# Patient Record
Sex: Female | Born: 1975 | Race: White | Hispanic: No | Marital: Married | Smoking: Never smoker
Health system: Southern US, Community
[De-identification: ages and names within clinical notes are randomized; demographics above are authoritative.]

## PROBLEM LIST (undated history)

## (undated) DIAGNOSIS — K219 Gastro-esophageal reflux disease without esophagitis: Secondary | ICD-10-CM

## (undated) HISTORY — DX: Gastro-esophageal reflux disease without esophagitis: K21.9

## (undated) HISTORY — PX: BACK SURGERY: SHX140

---

## 2001-03-18 ENCOUNTER — Inpatient Hospital Stay (HOSPITAL_COMMUNITY): Admission: AD | Admit: 2001-03-18 | Discharge: 2001-03-20 | Payer: Self-pay | Admitting: *Deleted

## 2004-08-31 ENCOUNTER — Ambulatory Visit (HOSPITAL_COMMUNITY): Admission: RE | Admit: 2004-08-31 | Discharge: 2004-08-31 | Payer: Self-pay | Admitting: Internal Medicine

## 2006-10-26 ENCOUNTER — Emergency Department (HOSPITAL_COMMUNITY): Admission: EM | Admit: 2006-10-26 | Discharge: 2006-10-26 | Payer: Self-pay | Admitting: Emergency Medicine

## 2008-01-26 ENCOUNTER — Ambulatory Visit (HOSPITAL_COMMUNITY): Admission: RE | Admit: 2008-01-26 | Discharge: 2008-01-26 | Payer: Self-pay | Admitting: General Surgery

## 2009-02-01 ENCOUNTER — Ambulatory Visit (HOSPITAL_COMMUNITY): Admission: RE | Admit: 2009-02-01 | Discharge: 2009-02-01 | Payer: Self-pay | Admitting: Family Medicine

## 2009-10-10 ENCOUNTER — Ambulatory Visit: Payer: Self-pay

## 2011-02-13 NOTE — H&P (Signed)
NAME:  Stacy Steele, Stacy Steele NO.:  [id number]   MEDICAL RECORD NO.:  [id number]          PATIENT TYPE:  AMB   LOCATION:  DAY                           FACILITY:  [facility]   PHYSICIAN:  Dalia Heading, M.D.  DATE OF BIRTH:  [birth year]   DATE OF ADMISSION:  DATE OF DISCHARGE:  LH                              HISTORY & PHYSICAL   CHIEF COMPLAINT:  Hematochezia, family history of colon carcinoma.   HISTORY OF PRESENT ILLNESS:  The patient is a 35 year old white female,  who is referred for endoscopic evaluation.  She needs a colonoscopy for  a history of hematochezia and also a family history of colon carcinoma.  Her mother was diagnosed with colon carcinoma at the age of 43 and has  since passed away.  No abdominal pain, weight loss, nausea, vomiting,  diarrhea, constipation, or melena have been noted.  She has noted some  blood on the toilet paper when she wipes herself.  She denies  hemorrhoidal disease.  She has never had a colonoscopy.   PAST MEDICAL HISTORY:  Unremarkable.   PAST SURGICAL HISTORY:  Unremarkable.   CURRENT MEDICATIONS:  Aciphex.   ALLERGIES:  No known drug allergies.   REVIEW OF SYSTEMS:  Noncontributory.   PHYSICAL EXAMINATION:  GENERAL:  The patient is a well-developed, well-  nourished white female, in no acute distress.  LUNGS:  Clear to auscultation with equal breath sounds bilaterally.  HEART:  Reveals a regular rate and rhythm without S3, S4, or murmurs.  ABDOMEN:  Soft, nontender, nondistended.  No hepatomegaly or masses  noted.  RECTAL:  Deferred to the procedure.   IMPRESSION:  Hematochezia, family history of colon carcinoma.   PLAN:  The patient is scheduled for a colonoscopy on January 26, 2008.  The risks and benefits of the procedure including bleeding and  perforation were fully explained to the patient, gave informed consent.      Dalia Heading, M.D.  Electronically Signed     MAJ/MEDQ  D:  01/06/2008  T:  01/07/2008   Job:  [id number]   cc:   Madelin Rear. Sherwood Gambler, MD  Fax: [phone]

## 2013-10-27 LAB — HM PAP SMEAR: HM Pap smear: NORMAL

## 2014-06-02 ENCOUNTER — Other Ambulatory Visit (HOSPITAL_COMMUNITY): Payer: Self-pay | Admitting: Internal Medicine

## 2014-06-02 DIAGNOSIS — M549 Dorsalgia, unspecified: Secondary | ICD-10-CM

## 2014-06-04 ENCOUNTER — Ambulatory Visit (HOSPITAL_COMMUNITY)
Admission: RE | Admit: 2014-06-04 | Discharge: 2014-06-04 | Disposition: A | Discharge status: Home / Self Care | Payer: 59 | Source: Ambulatory Visit | Attending: Internal Medicine | Admitting: Internal Medicine

## 2014-06-04 ENCOUNTER — Encounter (HOSPITAL_COMMUNITY): Payer: Self-pay

## 2014-06-04 DIAGNOSIS — G8929 Other chronic pain: Secondary | ICD-10-CM | POA: Diagnosis not present

## 2014-06-04 DIAGNOSIS — M5126 Other intervertebral disc displacement, lumbar region: Secondary | ICD-10-CM | POA: Diagnosis not present

## 2014-06-04 DIAGNOSIS — M129 Arthropathy, unspecified: Secondary | ICD-10-CM | POA: Diagnosis not present

## 2014-06-04 DIAGNOSIS — M545 Low back pain, unspecified: Secondary | ICD-10-CM | POA: Insufficient documentation

## 2014-06-04 DIAGNOSIS — M51379 Other intervertebral disc degeneration, lumbosacral region without mention of lumbar back pain or lower extremity pain: Secondary | ICD-10-CM | POA: Insufficient documentation

## 2014-06-04 DIAGNOSIS — M549 Dorsalgia, unspecified: Secondary | ICD-10-CM

## 2014-06-04 DIAGNOSIS — M47817 Spondylosis without myelopathy or radiculopathy, lumbosacral region: Secondary | ICD-10-CM | POA: Insufficient documentation

## 2014-06-04 DIAGNOSIS — M5137 Other intervertebral disc degeneration, lumbosacral region: Secondary | ICD-10-CM | POA: Diagnosis not present

## 2014-10-05 ENCOUNTER — Encounter (INDEPENDENT_AMBULATORY_CARE_PROVIDER_SITE_OTHER): Payer: Self-pay | Admitting: *Deleted

## 2014-11-08 ENCOUNTER — Ambulatory Visit (INDEPENDENT_AMBULATORY_CARE_PROVIDER_SITE_OTHER): Payer: 59 | Admitting: Internal Medicine

## 2014-11-08 ENCOUNTER — Encounter (INDEPENDENT_AMBULATORY_CARE_PROVIDER_SITE_OTHER): Payer: Self-pay | Admitting: Internal Medicine

## 2014-11-08 ENCOUNTER — Other Ambulatory Visit (INDEPENDENT_AMBULATORY_CARE_PROVIDER_SITE_OTHER): Payer: Self-pay | Admitting: *Deleted

## 2014-11-08 ENCOUNTER — Telehealth (INDEPENDENT_AMBULATORY_CARE_PROVIDER_SITE_OTHER): Payer: Self-pay | Admitting: *Deleted

## 2014-11-08 VITALS — BP 130/86 | HR 64 | Temp 99.0°F | Ht 68.0 in | Wt 241.6 lb

## 2014-11-08 DIAGNOSIS — Z800 Family history of malignant neoplasm of digestive organs: Secondary | ICD-10-CM

## 2014-11-08 DIAGNOSIS — K625 Hemorrhage of anus and rectum: Secondary | ICD-10-CM

## 2014-11-08 MED ORDER — PEG-KCL-NACL-NASULF-NA ASC-C 100 G PO SOLR: 1.0000 | Freq: Once | ORAL | Status: DC

## 2014-11-08 NOTE — Telephone Encounter (Signed)
Patient needs movi prep 

## 2014-11-08 NOTE — Progress Notes (Signed)
   Subjective:    Patient ID: Stacy Steele, female    DO: 07-30-1976, 39 y.o.   MRN: [id number]  HPI Referred to our office by Dr. Gerarda Fraction [facility]) for rectal bleeding/colonoscopy. Rectal bleeding started back in the summer. She says the bleeding was heavy. It turned the water in the commode red. Occurred 3-4 times. She says there were clots in the commode. Now she says the bleeding is lighter. She sees on the toilet tissue.  Her stools are normal caliber.  Stools are brown in color. No change in her stools.  There is not constipation.  Appetite is good. No weight loss. No abdominal pain, Family hx of colon cancer in a mother, deceased age 39.  LMP 10/25/14 normal.  She says her periods are heavy. Her last colonoscopy age 68 by Dr. Arnoldo Morale and was normal.  H and H 12.3 and 39.0, MCV 78.    Review of Systems Past Medical History  Diagnosis Date  . GERD (gastroesophageal reflux disease)     Past Surgical History  Procedure Laterality Date  . Back surgery      06/25/2014 lower back    No Known Allergies  No current outpatient prescriptions on file prior to visit.   No current facility-administered medications on file prior to visit.   Married, no children. Works at [employer]  One child age 17 in good health.     Objective:   Physical Exam Filed Vitals:   11/08/14 1059  Height: 5\' 8"  (1.727 m)  Weight: 241 lb 9.6 oz (109.589 kg)   Alert and oriented. Skin warm and dry. Oral mucosa is moist.   . Sclera anicteric, conjunctivae is pink. Thyroid not enlarged. No cervical lymphadenopathy. Lungs clear. Heart regular rate and rhythm.  Abdomen is soft. Bowel sounds are positive. No hepatomegaly. No abdominal masses felt. No tenderness.  No edema to lower extremities.   Rectal deferred.      Assessment & Plan:  Rectal bleeding. Family hx of colon cancer. Needs surveillance colonoscopy. The risks and benefits such as perforation, bleeding, and infection were  reviewed with the patient and is agreeable.

## 2014-11-08 NOTE — Patient Instructions (Signed)
Colonoscopy.  The risks and benefits such as perforation, bleeding, and infection were reviewed with the patient and is agreeable. 

## 2014-11-12 ENCOUNTER — Encounter (HOSPITAL_COMMUNITY): Payer: Self-pay | Admitting: *Deleted

## 2014-11-12 ENCOUNTER — Encounter (HOSPITAL_COMMUNITY)
Admission: RE | Disposition: A | Discharge status: Home / Self Care | Payer: Self-pay | Source: Ambulatory Visit | Attending: Internal Medicine

## 2014-11-12 ENCOUNTER — Ambulatory Visit (HOSPITAL_COMMUNITY)
Admission: RE | Admit: 2014-11-12 | Discharge: 2014-11-12 | Disposition: A | Discharge status: Home / Self Care | Payer: 59 | Source: Ambulatory Visit | Attending: Internal Medicine | Admitting: Internal Medicine

## 2014-11-12 DIAGNOSIS — Z79899 Other long term (current) drug therapy: Secondary | ICD-10-CM | POA: Diagnosis not present

## 2014-11-12 DIAGNOSIS — K644 Residual hemorrhoidal skin tags: Secondary | ICD-10-CM | POA: Diagnosis not present

## 2014-11-12 DIAGNOSIS — K648 Other hemorrhoids: Secondary | ICD-10-CM

## 2014-11-12 DIAGNOSIS — Z800 Family history of malignant neoplasm of digestive organs: Secondary | ICD-10-CM

## 2014-11-12 DIAGNOSIS — K921 Melena: Secondary | ICD-10-CM | POA: Diagnosis not present

## 2014-11-12 DIAGNOSIS — K219 Gastro-esophageal reflux disease without esophagitis: Secondary | ICD-10-CM | POA: Insufficient documentation

## 2014-11-12 DIAGNOSIS — K625 Hemorrhage of anus and rectum: Secondary | ICD-10-CM

## 2014-11-12 DIAGNOSIS — Z1211 Encounter for screening for malignant neoplasm of colon: Secondary | ICD-10-CM | POA: Insufficient documentation

## 2014-11-12 DIAGNOSIS — D125 Benign neoplasm of sigmoid colon: Secondary | ICD-10-CM | POA: Insufficient documentation

## 2014-11-12 HISTORY — PX: COLONOSCOPY: SHX5424

## 2014-11-12 SURGERY — COLONOSCOPY
Anesthesia: Moderate Sedation

## 2014-11-12 MED ORDER — STERILE WATER FOR IRRIGATION IR SOLN
Status: DC | PRN
  Administered 2014-11-12: 11:00:00

## 2014-11-12 MED ORDER — MIDAZOLAM HCL 5 MG/5ML IJ SOLN
INTRAMUSCULAR | Status: AC
  Filled 2014-11-12: qty 10

## 2014-11-12 MED ORDER — SODIUM CHLORIDE 0.9 % IV SOLN
INTRAVENOUS | Status: DC
  Administered 2014-11-12: 11:00:00 via INTRAVENOUS

## 2014-11-12 MED ORDER — BENEFIBER DRINK MIX PO PACK: 4.0000 g | PACK | Freq: Every day | ORAL | Status: AC

## 2014-11-12 MED ORDER — MIDAZOLAM HCL 5 MG/5ML IJ SOLN
INTRAMUSCULAR | Status: AC
  Filled 2014-11-12: qty 5

## 2014-11-12 MED ORDER — MIDAZOLAM HCL 5 MG/5ML IJ SOLN
INTRAMUSCULAR | Status: DC | PRN
  Administered 2014-11-12: 2 mg via INTRAVENOUS
  Administered 2014-11-12: 1 mg via INTRAVENOUS
  Administered 2014-11-12 (×4): 2 mg via INTRAVENOUS
  Administered 2014-11-12: 3 mg via INTRAVENOUS

## 2014-11-12 MED ORDER — MEPERIDINE HCL 50 MG/ML IJ SOLN
INTRAMUSCULAR | Status: DC | PRN
  Administered 2014-11-12 (×2): 25 mg via INTRAVENOUS

## 2014-11-12 MED ORDER — MEPERIDINE HCL 50 MG/ML IJ SOLN
INTRAMUSCULAR | Status: AC
  Filled 2014-11-12: qty 1

## 2014-11-12 NOTE — Discharge Instructions (Signed)
No aspirin or NSAIDs for 1 week. Resume usual medications and high fiber diet. Benefiber 4 g by mouth daily at bedtime. No driving for 24 hours. Physician will call with biopsy results.

## 2014-11-12 NOTE — Op Note (Addendum)
COLONOSCOPY PROCEDURE REPORT  PATIENT:  Stacy Steele  MR#:  [id number] Birthdate:  11-10-1975, 39 y.o., female Endoscopist:  Dr. Rogene Houston, MD Referred By:  Dr. Glo Herring, MD  Procedure Date: 11/12/2014  Procedure:   Colonoscopy with snare polypectomy.  Indications:  Patient is 80 old Caucasian female was intermittent hematochezia. Family history significant for CRC in a mother who was 53 at the time of diagnosis and died 2 years later of metastatic disease. Patient's last colonoscopy was 6 years ago.  Informed Consent:  The procedure and risks were reviewed with the patient and informed consent was obtained.  Medications:  Demerol 50 mg IV Versed 12 mg IV  Description of procedure:  After a digital rectal exam was performed, that colonoscope was advanced from the anus through the rectum and colon to the area of the cecum, ileocecal valve and appendiceal orifice. The cecum was deeply intubated. These structures were well-seen and photographed for the record. From the level of the cecum and ileocecal valve, the scope was slowly and cautiously withdrawn. The mucosal surfaces were carefully surveyed utilizing scope tip to flexion to facilitate fold flattening as needed. The scope was pulled down into the rectum where a thorough exam including retroflexion was performed.  Findings:   Marginal prep at ascending colon and cecum. Landmarks were well seen after vigorous washing. 6 mm broad-based polyp hot snared from distal sigmoid colon. Normal rectal mucosa. Hemorrhoids below the dentate line.   Therapeutic/Diagnostic Maneuvers Performed:  See above  Complications:  None  Cecal Withdrawal Time:  13 minutes  Impression:  Examination performed to cecum. Marginal prep at ascending colon and cecum. Landmarks well seen after vigorous washing. 6 mm polyp hot snare from distal sigmoid colon. External hemorrhoids.  Recommendations:  Standard instructions given. Benefiber 4  g by mouth daily at bedtime. I will contact patient with biopsy results and further recommendations. She will need to day prep prior to her next exam in 5 years.  REHMAN,NAJEEB U  11/12/2014 12:09 PM  CC: Dr. Glo Herring., MD & Dr. Rayne Du ref. provider found

## 2014-11-12 NOTE — H&P (Addendum)
Stacy Steele is an 39 y.o. female.   Chief Complaint: Patient is here for colonoscopy. HPI: Patient is 39 year old Caucasian female who is here for colonoscopy for diagnostic and screening purposes. She has intermittent hematochezia. It is always fresh blood with bowel movements. She denies abdominal pain diarrhea constipation. Last colonoscopy was 6 years ago. Family history significant for CRC in mother was 74 at the time of diagnosis and died of metastatic disease at age 31.  Past Medical History  Diagnosis Date  . GERD (gastroesophageal reflux disease)     Past Surgical History  Procedure Laterality Date  . Back surgery      06/25/2014 lower back    Family History  Problem Relation Age of Onset  . Colon cancer Mother 47   Social History:  reports that she has never smoked. She does not have any smokeless tobacco history on file. She reports that she does not drink alcohol or use illicit drugs.  Allergies: No Known Allergies  Medications Prior to Admission  Medication Sig Dispense Refill  . CALCIUM PO Take 1 tablet by mouth daily.    Marland Kitchen HYDROcodone-acetaminophen (NORCO) 10-325 MG per tablet Take 1 tablet by mouth every 6 (six) hours as needed for moderate pain.     Marland Kitchen lansoprazole (PREVACID) 15 MG capsule Take 15 mg by mouth daily at 12 noon.    . peg 3350 powder (MOVIPREP) 100 G SOLR Take 1 kit (200 g total) by mouth once. 1 kit 0  . Prenatal Vit-Fe Fumarate-FA (PRENATAL MULTIVITAMIN) TABS tablet Take 1 tablet by mouth daily at 12 noon.      No results found for this or any previous visit (from the past 48 hour(s)). No results found.  ROS  Blood pressure 121/56, pulse 72, temperature 98.1 F (36.7 C), temperature source Oral, resp. rate 22, height _0  (1.727 m), weight 241 lb (109.317 kg), last menstrual period 10/22/2014, SpO2 100 %. Physical Exam  Constitutional: She appears well-developed and well-nourished.  HENT:  Mouth/Throat: Oropharynx is clear and moist.   Eyes: Conjunctivae are normal. No scleral icterus.  Neck: No thyromegaly present.  Cardiovascular: Normal rate, regular rhythm and normal heart sounds.   No murmur heard. Respiratory: Effort normal and breath sounds normal.  GI: Soft. She exhibits no distension and no mass. There is no tenderness.  Musculoskeletal: She exhibits no edema.  Lymphadenopathy:    She has no cervical adenopathy.  Neurological: She is alert.  Skin: Skin is warm and dry.     Assessment/Plan Hematochezia. Family history of CRC in mother at age 69. Diagnostic/high-risk screening colonoscopy.  REHMAN,NAJEEB U 11/12/2014, 11:22 AM

## 2014-11-15 ENCOUNTER — Encounter (HOSPITAL_COMMUNITY): Payer: Self-pay | Admitting: Internal Medicine

## 2014-11-17 ENCOUNTER — Encounter (INDEPENDENT_AMBULATORY_CARE_PROVIDER_SITE_OTHER): Payer: Self-pay | Admitting: *Deleted

## 2014-11-18 ENCOUNTER — Encounter (INDEPENDENT_AMBULATORY_CARE_PROVIDER_SITE_OTHER): Payer: Self-pay

## 2015-06-18 ENCOUNTER — Encounter: Payer: Self-pay | Admitting: Emergency Medicine

## 2015-06-18 ENCOUNTER — Emergency Department
Admission: EM | Admit: 2015-06-18 | Discharge: 2015-06-18 | Disposition: A | Discharge status: Home / Self Care | Payer: Medicaid Other | Attending: Emergency Medicine | Admitting: Emergency Medicine

## 2015-06-18 ENCOUNTER — Emergency Department: Payer: Medicaid Other

## 2015-06-18 DIAGNOSIS — Z79899 Other long term (current) drug therapy: Secondary | ICD-10-CM | POA: Diagnosis not present

## 2015-06-18 DIAGNOSIS — Z3A09 9 weeks gestation of pregnancy: Secondary | ICD-10-CM | POA: Insufficient documentation

## 2015-06-18 DIAGNOSIS — O200 Threatened abortion: Secondary | ICD-10-CM | POA: Diagnosis not present

## 2015-06-18 DIAGNOSIS — O9989 Other specified diseases and conditions complicating pregnancy, childbirth and the puerperium: Secondary | ICD-10-CM | POA: Diagnosis present

## 2015-06-18 LAB — ABO/RH: ABO/RH(D): A NEG

## 2015-06-18 LAB — COMPREHENSIVE METABOLIC PANEL
ALBUMIN: 4.3 g/dL (ref 3.5–5.0)
ALT: 26 U/L (ref 14–54)
ANION GAP: 7 (ref 5–15)
AST: 31 U/L (ref 15–41)
Alkaline Phosphatase: 59 U/L (ref 38–126)
BILIRUBIN TOTAL: 0.7 mg/dL (ref 0.3–1.2)
BUN: <5 mg/dL — ABNORMAL LOW (ref 6–20)
CO2: 28 mmol/L (ref 22–32)
Calcium: 9.2 mg/dL (ref 8.9–10.3)
Chloride: 103 mmol/L (ref 101–111)
Creatinine, Ser: 0.68 mg/dL (ref 0.44–1.00)
GFR calc non Af Amer: >60 mL/min (ref >60)
Glucose, Bld: 98 mg/dL (ref 65–99)
POTASSIUM: 3.4 mmol/L — AB (ref 3.5–5.1)
Sodium: 138 mmol/L (ref 135–145)
Total Protein: 7.7 g/dL (ref 6.5–8.1)

## 2015-06-18 LAB — URINALYSIS COMPLETE WITH MICROSCOPIC (ARMC ONLY)
BILIRUBIN URINE: NEGATIVE
Bacteria, UA: NONE SEEN
Glucose, UA: NEGATIVE mg/dL
KETONES UR: NEGATIVE mg/dL
LEUKOCYTES UA: NEGATIVE
NITRITE: NEGATIVE
PH: 6 (ref 5.0–8.0)
PROTEIN: NEGATIVE mg/dL
SPECIFIC GRAVITY, URINE: 1.016 (ref 1.005–1.030)

## 2015-06-18 LAB — CBC
HCT: 41.4 % (ref 35.0–47.0)
Hemoglobin: 13.1 g/dL (ref 12.0–16.0)
MCH: 25.1 pg — ABNORMAL LOW (ref 26.0–34.0)
MCHC: 31.6 g/dL — ABNORMAL LOW (ref 32.0–36.0)
MCV: 79.4 fL — ABNORMAL LOW (ref 80.0–100.0)
Platelets: 181 10*3/uL (ref 150–440)
RBC: 5.21 MIL/uL — AB (ref 3.80–5.20)
RDW: 18.1 % — ABNORMAL HIGH (ref 11.5–14.5)
WBC: 11.7 10*3/uL — AB (ref 3.6–11.0)

## 2015-06-18 LAB — HCG, QUANTITATIVE, PREGNANCY: HCG, BETA CHAIN, QUANT, S: 16622 m[IU]/mL — AB (ref <5)

## 2015-06-18 LAB — LIPASE, BLOOD: Lipase: 29 U/L (ref 22–51)

## 2015-06-18 NOTE — ED Notes (Signed)
Pt. Going home with friend 

## 2015-06-18 NOTE — Discharge Instructions (Signed)
Please follow-up with your OB/GYN physician on Monday for recheck. Beta hCG level. Today's level is 16,000. Return to the emergency department for any increased abdominal pain, or any other symptom personally concerning to your self.   Threatened Miscarriage A threatened miscarriage occurs when you have vaginal bleeding during your first 20 weeks of pregnancy but the pregnancy has not ended. If you have vaginal bleeding during this time, your health care provider will do tests to make sure you are still pregnant. If the tests show you are still pregnant and the developing baby (fetus) inside your womb (uterus) is still growing, your condition is considered a threatened miscarriage. A threatened miscarriage does not mean your pregnancy will end, but it does increase the risk of losing your pregnancy (complete miscarriage). CAUSES  The cause of a threatened miscarriage is usually not known. If you go on to have a complete miscarriage, the most common cause is an abnormal number of chromosomes in the developing baby. Chromosomes are the structures inside cells that hold all your genetic material. Some causes of vaginal bleeding that do not result in miscarriage include:  Having sex.  Having an infection.  Normal hormone changes of pregnancy.  Bleeding that occurs when an egg implants in your uterus. RISK FACTORS Risk factors for bleeding in early pregnancy include:  Obesity.  Smoking.  Drinking excessive amounts of alcohol or caffeine.  Recreational drug use. SIGNS AND SYMPTOMS  Light vaginal bleeding.  Mild abdominal pain or cramps. DIAGNOSIS  If you have bleeding with or without abdominal pain before 20 weeks of pregnancy, your health care provider will do tests to check whether you are still pregnant. One important test involves using sound waves and a computer (ultrasound) to create images of the inside of your uterus. Other tests include an internal exam of your vagina and uterus  (pelvic exam) and measurement of your baby's heart rate.  You may be diagnosed with a threatened miscarriage if:  Ultrasound testing shows you are still pregnant.  Your baby's heart rate is strong.  A pelvic exam shows that the opening between your uterus and your vagina (cervix) is closed.  Your heart rate and blood pressure are stable.  Blood tests confirm you are still pregnant. TREATMENT  No treatments have been shown to prevent a threatened miscarriage from going on to a complete miscarriage. However, the right home care is important.  HOME CARE INSTRUCTIONS   Make sure you keep all your appointments for prenatal care. This is very important.  Get plenty of rest.  Do not have sex or use tampons if you have vaginal bleeding.  Do not douche.  Do not smoke or use recreational drugs.  Do not drink alcohol.  Avoid caffeine. SEEK MEDICAL CARE IF:  You have light vaginal bleeding or spotting while pregnant.  You have abdominal pain or cramping.  You have a fever. SEEK IMMEDIATE MEDICAL CARE IF:  You have heavy vaginal bleeding.  You have blood clots coming from your vagina.  You have severe low back pain or abdominal cramps.  You have fever, chills, and severe abdominal pain. MAKE SURE YOU:  Understand these instructions.  Will watch your condition.  Will get help right away if you are not doing well or get worse. Document Released: 09/17/2005 Document Revised: 09/22/2013 Document Reviewed: 07/14/2013 [facility] Patient Information 2015 [place], [place]. This information is not intended to replace advice given to you by your health care provider. Make sure you discuss any questions you have  with your health care provider. ° °

## 2015-06-18 NOTE — ED Notes (Addendum)
Pt reports she is [redacted] weeks pregnant but bleeding since this am with small clots. Cramping abd pain. Pt has had once prior ultrasound at 6 weeks that was WNL

## 2015-06-18 NOTE — ED Provider Notes (Signed)
[facility] Emergency Department Provider Note  Time seen: 5:17 PM  I have reviewed the triage vital signs and the nursing notes.   HISTORY  Chief Complaint Abdominal Pain    HPI Stacy Steele is a 39 y.o. female with a past medical history of gastric reflux who is [redacted] weeks pregnant and presents for vaginal bleeding. According to the patient she sees [facility], had an ultrasound done in 6 weeks that was normal for her. This is the patient's second pregnancy, but her first pregnancy was 14 years ago. States mild abdominal cramping, denies any "pain.". Denies any nausea, vomiting, diarrhea, dysuria. States the vaginal bleeding was moderate last night, and mild/spotting today. States several small clots yesterday night.    Past Medical History  Diagnosis Date  . GERD (gastroesophageal reflux disease)     Patient Active Problem List   Diagnosis Date Noted  . Family hx of colon cancer 11/08/2014    Past Surgical History  Procedure Laterality Date  . Back surgery      06/25/2014 lower back  . Colonoscopy N/A 11/12/2014    Procedure: COLONOSCOPY;  Surgeon: Rogene Houston, MD;  Location: [facility] ENDO SUITE;  Service: Endoscopy;  Laterality: N/A;  120 - moved to 11:25 - Ann to notify pt    Current Outpatient Rx  Name  Route  Sig  Dispense  Refill  . CALCIUM PO   Oral   Take 1 tablet by mouth daily.         Marland Kitchen HYDROcodone-acetaminophen (NORCO) 10-325 MG per tablet   Oral   Take 1 tablet by mouth every 6 (six) hours as needed for moderate pain.          Marland Kitchen lansoprazole (PREVACID) 15 MG capsule   Oral   Take 15 mg by mouth daily at 12 noon.         . Prenatal Vit-Fe Fumarate-FA (PRENATAL MULTIVITAMIN) TABS tablet   Oral   Take 1 tablet by mouth daily at 12 noon.         . Wheat Dextrin (BENEFIBER DRINK MIX) PACK   Oral   Take 4 g by mouth at bedtime.           Allergies Review of patient's allergies indicates no known  allergies.  Family History  Problem Relation Age of Onset  . Colon cancer Mother 54    Social History Social History  Substance Use Topics  . Smoking status: Never Smoker   . Smokeless tobacco: None  . Alcohol Use: No    Review of Systems Constitutional: Negative for fever. Cardiovascular: Negative for chest pain. Respiratory: Negative for shortness of breath. Gastrointestinal: Mild lower abdominal cramping. Genitourinary: Negative for dysuria. Neurological: Negative for headache 10-point ROS otherwise negative.  ____________________________________________   PHYSICAL EXAM:  VITAL SIGNS: ED Triage Vitals  Enc Vitals Group     BP 06/18/15 1434 147/59 mmHg     Pulse Rate 06/18/15 1434 70     Resp 06/18/15 1434 16     Temp 06/18/15 1434 98.4 F (36.9 C)     Temp Source 06/18/15 1434 Oral     SpO2 06/18/15 1434 99 %     Weight 06/18/15 1434 240 lb (108.863 kg)     Height 06/18/15 1434 5\' 8"  (1.727 m)     Head Cir --      Peak Flow --      Pain Score --      Pain Loc --  Pain Edu? --      Excl. in [place]? --     Constitutional: Alert and oriented. Well appearing and in no distress. Eyes: Normal exam ENT   Mouth/Throat: Mucous membranes are moist. Cardiovascular: Normal rate, regular rhythm. No murmur Respiratory: Normal respiratory effort without tachypnea nor retractions. Breath sounds are clear  Gastrointestinal: Soft and nontender. No distention.  There is no CVA tenderness. Musculoskeletal: Nontender with normal range of motion in all extremities.  Neurologic:  Normal speech and language. No gross focal neurologic deficits are appreciated. Psychiatric: Mood and affect are normal. Speech and behavior are normal. Patient exhibits appropriate insight and judgment.  ____________________________________________   RADIOLOGY  You sac and fetal pole present, no cardiac activity noted.  ____________________________________________    INITIAL IMPRESSION  / ASSESSMENT AND PLAN / ED COURSE  Pertinent labs & imaging results that were available during my care of the patient were reviewed by me and considered in my medical decision making (see chart for details).  Patient is a partially [redacted] weeks pregnant who presents for vaginal bleeding. Labs are largely within normal limits. We'll proceed with ultrasound help further evaluate. Patient states mild lower abdominal cramping, but has a nontender exam.  Patient's labs are largely within normal limits. Patient is ultrasound shows a fetal pole, but no definitive cardiac activity. This could possibly be due to an early pregnancy versus miscarriage. We'll have the patient follow-up with OB/GYN in 48-72 hours for recheck of her beta hCG level, and repeat ultrasound in 7-10 days. Patient is agreeable to plan.  ____________________________________________   FINAL CLINICAL IMPRESSION(S) / ED DIAGNOSES  Threatened abortion   Harvest Dark, MD 06/18/15 [phone]

## 2016-06-08 IMAGING — US US OB COMP LESS 14 WK
1 series · 13 of 28 positions shown · non-contrast
Comparison: None.

CLINICAL DATA: Lower abdominal cramping and bleeding. Patient is
pregnant based on the last menstrual period.

EXAM:
OBSTETRIC <14 WK US AND TRANSVAGINAL OB US
TECHNIQUE: Both transabdominal and transvaginal ultrasound examinations were
performed for complete evaluation of the gestation as well as the
maternal uterus, adnexal regions, and pelvic cul-de-sac.
Transvaginal technique was performed to assess early pregnancy.

[Series 1: us ob comp less 14 wk · 0.25mm/px · 13 of 79 slices shown]
[im 3/79]
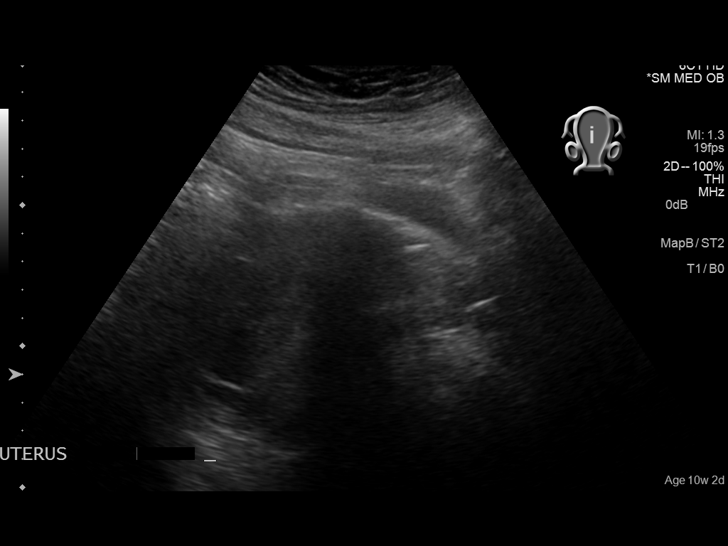
[im 9/79]
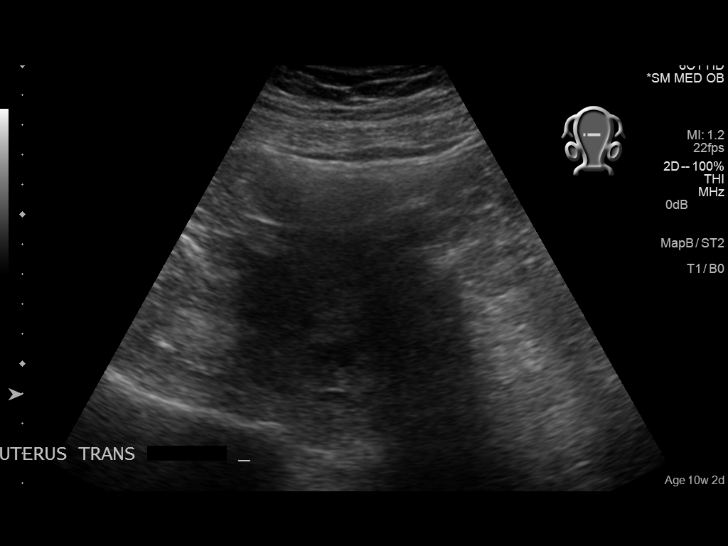
[im 15/79]
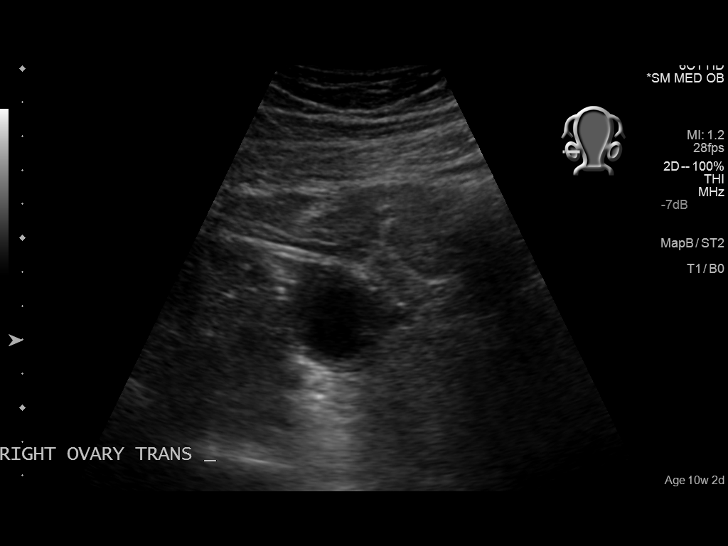
[im 21/79]
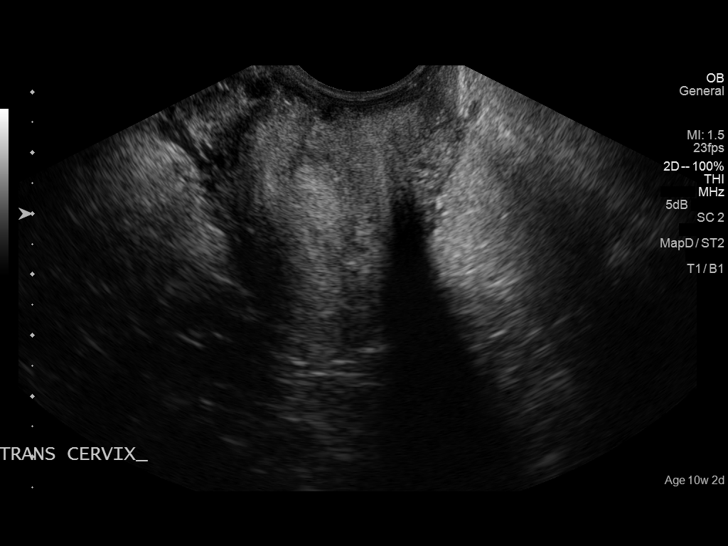
[im 27/79]
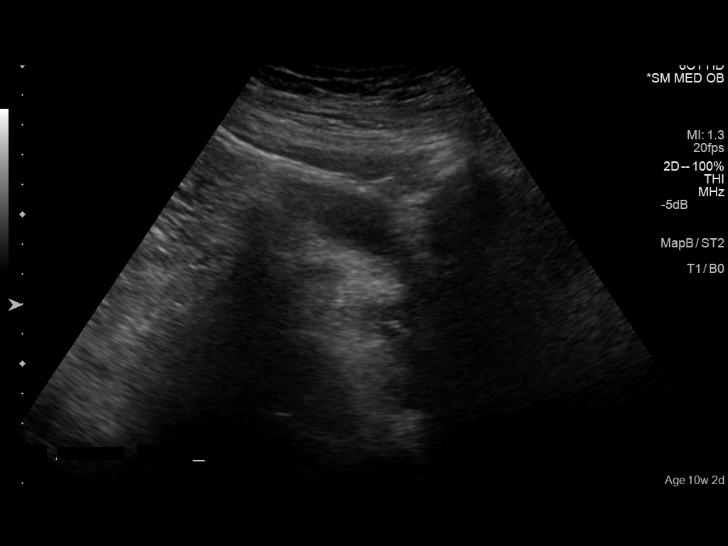
[im 32/79]
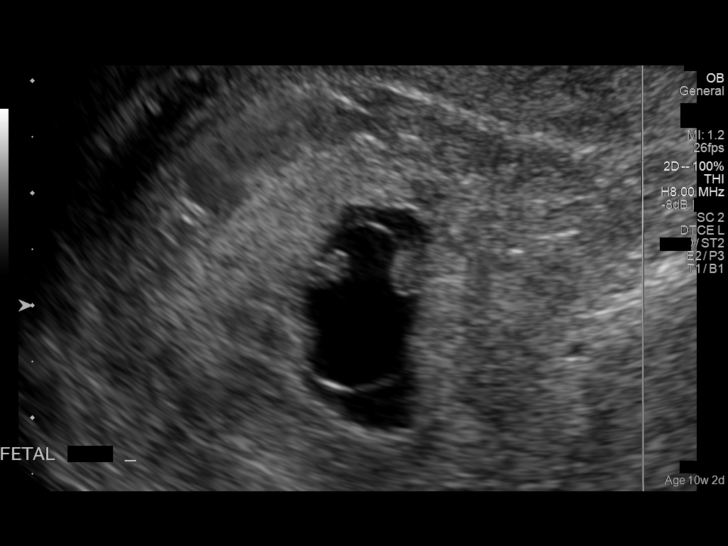
[im 41/79]
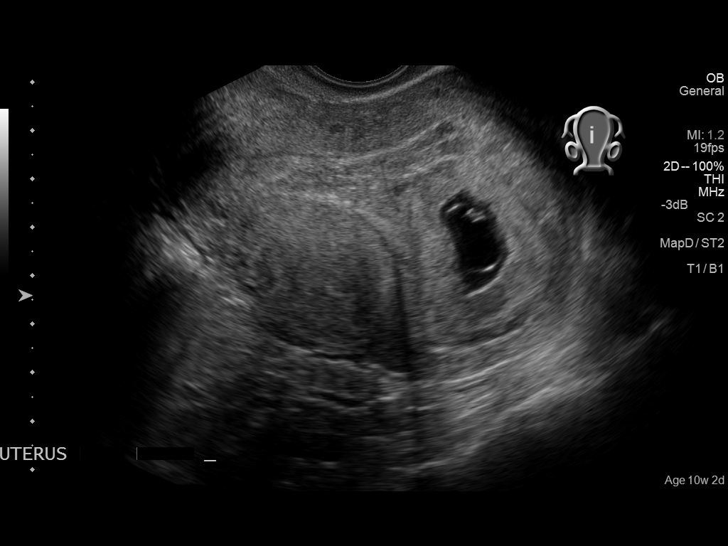
[im 47/79]
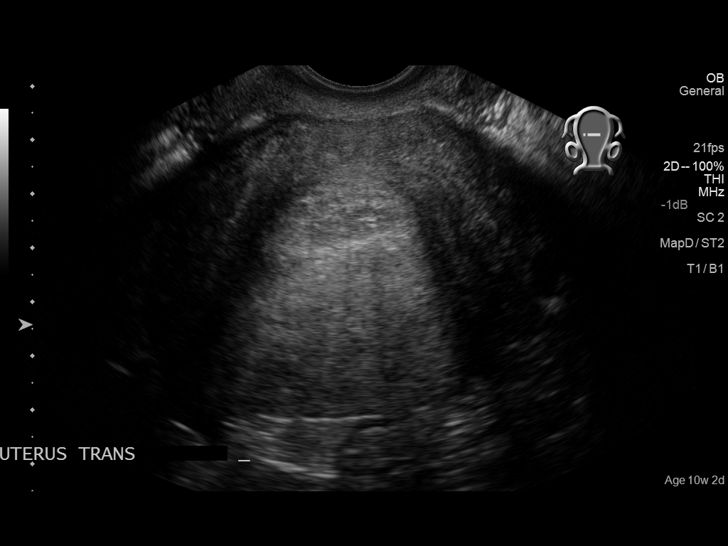
[im 53/79]
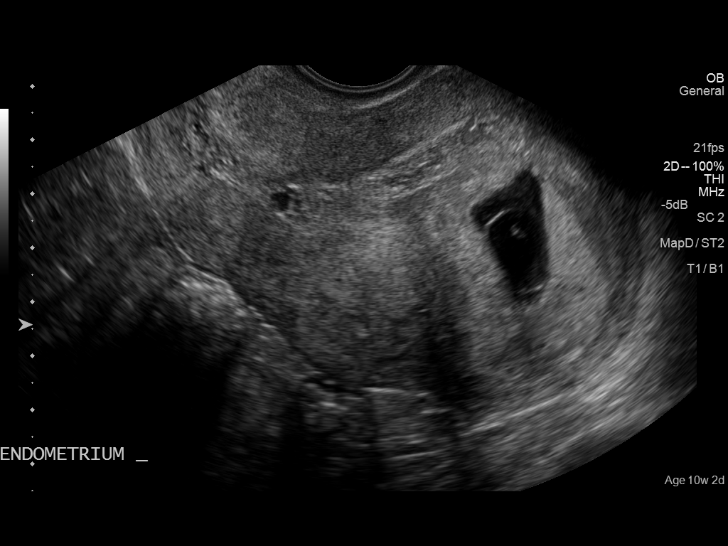
[im 58/79]
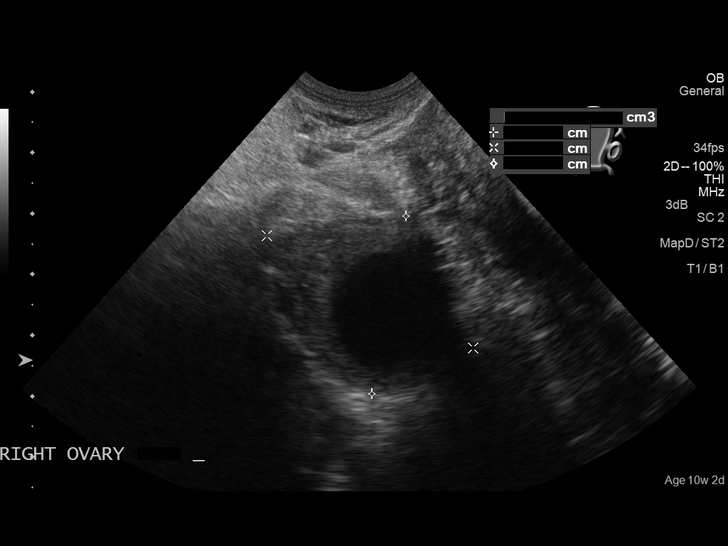
[im 64/79]
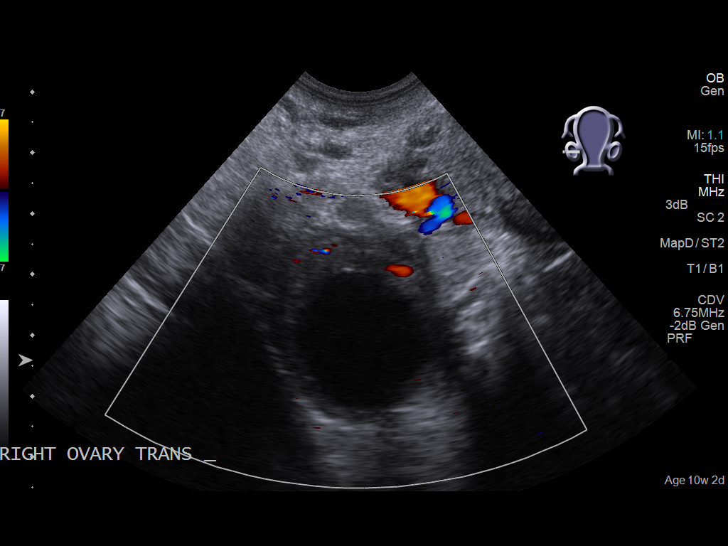
[im 70/79]
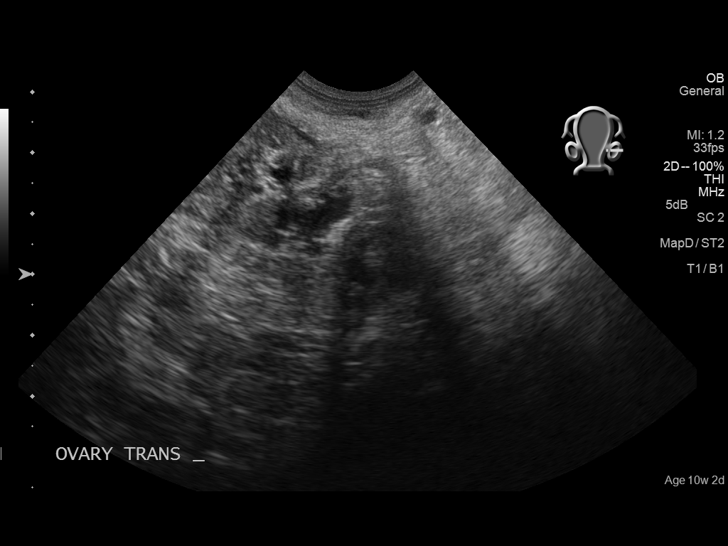
[im 76/79]
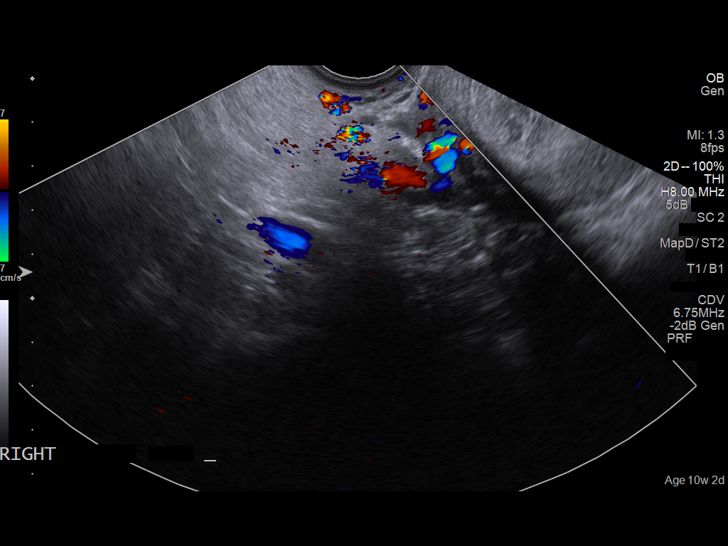

[13 of 28 positions shown; findings below may reference images not displayed]

FINDINGS: Intrauterine gestational sac: Visualized/normal in shape.

Yolk sac:  Yes

Embryo:  Yes

Cardiac Activity: Not definitively seen

Heart Rate: No measurable heart right.

CRL:  4.5  mm   6 w   1 d                  US EDC: 02/10/2016

Maternal uterus/adnexae: There is a probable uterine fibroid
measuring 3.1 x 3.0 x 3.1 cm, mural and location. Somewhat
heterogeneous endometrium. Probable corpus luteum arises from the
right ovary measuring 2.2 cm. Normal left ovary. No adnexal masses.
No pelvic free fluid. No subchorionic hemorrhage.
IMPRESSION: 1. There is a gestational sac containing a yolk sac and fetal pole,
but no defined Cardiac activity or measurable heart beat. Lack
Cardiac activity may simply be from the small size of the embryo.
Recommend followup ultrasound in 7-10 days as well as repeat beta
HCG level. No subchorionic hemorrhage.
2. Probable 3.1 cm uterine fibroid, mural.
3. Ovaries and adnexa are unremarkable.

## 2017-11-22 ENCOUNTER — Encounter: Payer: Self-pay | Admitting: Advanced Practice Midwife

## 2017-11-22 ENCOUNTER — Ambulatory Visit (INDEPENDENT_AMBULATORY_CARE_PROVIDER_SITE_OTHER): Payer: Managed Care, Other (non HMO) | Admitting: Advanced Practice Midwife

## 2017-11-22 VITALS — BP 126/84 | Ht 68.0 in | Wt 236.0 lb

## 2017-11-22 DIAGNOSIS — Z01419 Encounter for gynecological examination (general) (routine) without abnormal findings: Secondary | ICD-10-CM | POA: Diagnosis not present

## 2017-11-22 DIAGNOSIS — Z1239 Encounter for other screening for malignant neoplasm of breast: Secondary | ICD-10-CM

## 2017-11-22 DIAGNOSIS — Z124 Encounter for screening for malignant neoplasm of cervix: Secondary | ICD-10-CM

## 2017-11-22 DIAGNOSIS — Z0000 Encounter for general adult medical examination without abnormal findings: Secondary | ICD-10-CM

## 2017-11-22 DIAGNOSIS — Z1231 Encounter for screening mammogram for malignant neoplasm of breast: Secondary | ICD-10-CM

## 2017-11-22 NOTE — Patient Instructions (Signed)
Health Maintenance, Female Adopting a healthy lifestyle and getting preventive care can go a long way to promote health and wellness. Talk with your health care provider about what schedule of regular examinations is right for you. This is a good chance for you to check in with your provider about disease prevention and staying healthy. In between checkups, there are plenty of things you can do on your own. Experts have done a lot of research about which lifestyle changes and preventive measures are most likely to keep you healthy. Ask your health care provider for more information. Weight and diet Eat a healthy diet  Be sure to include plenty of vegetables, fruits, low-fat dairy products, and lean protein.  Do not eat a lot of foods high in solid fats, added sugars, or salt.  Get regular exercise. This is one of the most important things you can do for your health. ? Most adults should exercise for at least 150 minutes each week. The exercise should increase your heart rate and make you sweat (moderate-intensity exercise). ? Most adults should also do strengthening exercises at least twice a week. This is in addition to the moderate-intensity exercise.  Maintain a healthy weight  Body mass index (BMI) is a measurement that can be used to identify possible weight problems. It estimates body fat based on height and weight. Your health care provider can help determine your BMI and help you achieve or maintain a healthy weight.  For females 69 years of age and older: ? A BMI below 18.5 is considered underweight. ? A BMI of 18.5 to 24.9 is normal. ? A BMI of 25 to 29.9 is considered overweight. ? A BMI of 30 and above is considered obese.  Watch levels of cholesterol and blood lipids  You should start having your blood tested for lipids and cholesterol at 40 years of age, then have this test every 5 years.  You may need to have your cholesterol levels checked more often if: ? Your lipid or  cholesterol levels are high. ? You are older than 40 years of age. ? You are at high risk for heart disease.  Cancer screening Lung Cancer  Lung cancer screening is recommended for adults 70-27 years old who are at high risk for lung cancer because of a history of smoking.  A yearly low-dose CT scan of the lungs is recommended for people who: ? Currently smoke. ? Have quit within the past 15 years. ? Have at least a 30-pack-year history of smoking. A pack year is smoking an average of one pack of cigarettes a day for 1 year.  Yearly screening should continue until it has been 15 years since you quit.  Yearly screening should stop if you develop a health problem that would prevent you from having lung cancer treatment.  Breast Cancer  Practice breast self-awareness. This means understanding how your breasts normally appear and feel.  It also means doing regular breast self-exams. Let your health care provider know about any changes, no matter how small.  If you are in your 20s or 30s, you should have a clinical breast exam (CBE) by a health care provider every 1-3 years as part of a regular health exam.  If you are 68 or older, have a CBE every year. Also consider having a breast X-ray (mammogram) every year.  If you have a family history of breast cancer, talk to your health care provider about genetic screening.  If you are at high risk  for breast cancer, talk to your health care provider about having an MRI and a mammogram every year.  Breast cancer gene (BRCA) assessment is recommended for women who have family members with BRCA-related cancers. BRCA-related cancers include: ? Breast. ? Ovarian. ? Tubal. ? Peritoneal cancers.  Results of the assessment will determine the need for genetic counseling and BRCA1 and BRCA2 testing.  Cervical Cancer Your health care provider may recommend that you be screened regularly for cancer of the pelvic organs (ovaries, uterus, and  vagina). This screening involves a pelvic examination, including checking for microscopic changes to the surface of your cervix (Pap test). You may be encouraged to have this screening done every 3 years, beginning at age 22.  For women ages 56-65, health care providers may recommend pelvic exams and Pap testing every 3 years, or they may recommend the Pap and pelvic exam, combined with testing for human papilloma virus (HPV), every 5 years. Some types of HPV increase your risk of cervical cancer. Testing for HPV may also be done on women of any age with unclear Pap test results.  Other health care providers may not recommend any screening for nonpregnant women who are considered low risk for pelvic cancer and who do not have symptoms. Ask your health care provider if a screening pelvic exam is right for you.  If you have had past treatment for cervical cancer or a condition that could lead to cancer, you need Pap tests and screening for cancer for at least 20 years after your treatment. If Pap tests have been discontinued, your risk factors (such as having a new sexual partner) need to be reassessed to determine if screening should resume. Some women have medical problems that increase the chance of getting cervical cancer. In these cases, your health care provider may recommend more frequent screening and Pap tests.  Colorectal Cancer  This type of cancer can be detected and often prevented.  Routine colorectal cancer screening usually begins at 40 years of age and continues through 40 years of age.  Your health care provider may recommend screening at an earlier age if you have risk factors for colon cancer.  Your health care provider may also recommend using home test kits to check for hidden blood in the stool.  A small camera at the end of a tube can be used to examine your colon directly (sigmoidoscopy or colonoscopy). This is done to check for the earliest forms of colorectal  cancer.  Routine screening usually begins at age 33.  Direct examination of the colon should be repeated every 5-10 years through 40 years of age. However, you may need to be screened more often if early forms of precancerous polyps or small growths are found.  Skin Cancer  Check your skin from head to toe regularly.  Tell your health care provider about any new moles or changes in moles, especially if there is a change in a mole's shape or color.  Also tell your health care provider if you have a mole that is larger than the size of a pencil eraser.  Always use sunscreen. Apply sunscreen liberally and repeatedly throughout the day.  Protect yourself by wearing long sleeves, pants, a wide-brimmed hat, and sunglasses whenever you are outside.  Heart disease, diabetes, and high blood pressure  High blood pressure causes heart disease and increases the risk of stroke. High blood pressure is more likely to develop in: ? People who have blood pressure in the high end of  the normal range (130-139/85-89 mm Hg). ? People who are overweight or obese. ? People who are African American.  If you are 21-29 years of age, have your blood pressure checked every 3-5 years. If you are 3 years of age or older, have your blood pressure checked every year. You should have your blood pressure measured twice-once when you are at a hospital or clinic, and once when you are not at a hospital or clinic. Record the average of the two measurements. To check your blood pressure when you are not at a hospital or clinic, you can use: ? An automated blood pressure machine at a pharmacy. ? A home blood pressure monitor.  If you are between 17 years and 37 years old, ask your health care provider if you should take aspirin to prevent strokes.  Have regular diabetes screenings. This involves taking a blood sample to check your fasting blood sugar level. ? If you are at a normal weight and have a low risk for diabetes,  have this test once every three years after 40 years of age. ? If you are overweight and have a high risk for diabetes, consider being tested at a younger age or more often. Preventing infection Hepatitis B  If you have a higher risk for hepatitis B, you should be screened for this virus. You are considered at high risk for hepatitis B if: ? You were born in a country where hepatitis B is common. Ask your health care provider which countries are considered high risk. ? Your parents were born in a high-risk country, and you have not been immunized against hepatitis B (hepatitis B vaccine). ? You have HIV or AIDS. ? You use needles to inject street drugs. ? You live with someone who has hepatitis B. ? You have had sex with someone who has hepatitis B. ? You get hemodialysis treatment. ? You take certain medicines for conditions, including cancer, organ transplantation, and autoimmune conditions.  Hepatitis C  Blood testing is recommended for: ? Everyone born from [birth year] through 1963. ? Anyone with known risk factors for hepatitis C.  Sexually transmitted infections (STIs)  You should be screened for sexually transmitted infections (STIs) including gonorrhea and chlamydia if: ? You are sexually active and are younger than 40 years of age. ? You are older than 40 years of age and your health care provider tells you that you are at risk for this type of infection. ? Your sexual activity has changed since you were last screened and you are at an increased risk for chlamydia or gonorrhea. Ask your health care provider if you are at risk.  If you do not have HIV, but are at risk, it may be recommended that you take a prescription medicine daily to prevent HIV infection. This is called pre-exposure prophylaxis (PrEP). You are considered at risk if: ? You are sexually active and do not regularly use condoms or know the HIV status of your partner(s). ? You take drugs by injection. ? You are  sexually active with a partner who has HIV.  Talk with your health care provider about whether you are at high risk of being infected with HIV. If you choose to begin PrEP, you should first be tested for HIV. You should then be tested every 3 months for as long as you are taking PrEP. Pregnancy  If you are premenopausal and you may become pregnant, ask your health care provider about preconception counseling.  If you may become  pregnant, take 400 to 800 micrograms (mcg) of folic acid every day.  If you want to prevent pregnancy, talk to your health care provider about birth control (contraception). Osteoporosis and menopause  Osteoporosis is a disease in which the bones lose minerals and strength with aging. This can result in serious bone fractures. Your risk for osteoporosis can be identified using a bone density scan.  If you are 52 years of age or older, or if you are at risk for osteoporosis and fractures, ask your health care provider if you should be screened.  Ask your health care provider whether you should take a calcium or vitamin D supplement to lower your risk for osteoporosis.  Menopause may have certain physical symptoms and risks.  Hormone replacement therapy may reduce some of these symptoms and risks. Talk to your health care provider about whether hormone replacement therapy is right for you. Follow these instructions at home:  Schedule regular health, dental, and eye exams.  Stay current with your immunizations.  Do not use any tobacco products including cigarettes, chewing tobacco, or electronic cigarettes.  If you are pregnant, do not drink alcohol.  If you are breastfeeding, limit how much and how often you drink alcohol.  Limit alcohol intake to no more than 1 drink per day for nonpregnant women. One drink equals 12 ounces of beer, 5 ounces of wine, or 1 ounces of hard liquor.  Do not use street drugs.  Do not share needles.  Ask your health care  provider for help if you need support or information about quitting drugs.  Tell your health care provider if you often feel depressed.  Tell your health care provider if you have ever been abused or do not feel safe at home. This information is not intended to replace advice given to you by your health care provider. Make sure you discuss any questions you have with your health care provider. Document Released: 04/02/2011 Document Revised: 02/23/2016 Document Reviewed: 06/21/2015 Elsevier Interactive Patient Education  2018 [employer].     Why follow it? Research shows. . Those who follow the Mediterranean diet have a reduced risk of heart disease  . The diet is associated with a reduced incidence of Parkinson's and Alzheimer's diseases . People following the diet may have longer life expectancies and lower rates of chronic diseases  . The Dietary Guidelines for Americans recommends the Mediterranean diet as an eating plan to promote health and prevent disease  What Is the Mediterranean Diet?  . Healthy eating plan based on typical foods and recipes of Mediterranean-style cooking . The diet is primarily a plant based diet; these foods should make up a majority of meals   Starches - Plant based foods should make up a majority of meals - They are an important sources of vitamins, minerals, energy, antioxidants, and fiber - Choose whole grains, foods high in fiber and minimally processed items  - Typical grain sources include wheat, oats, barley, corn, brown rice, bulgar, farro, millet, polenta, couscous  - Various types of beans include chickpeas, lentils, fava beans, black beans, white beans   Fruits  Veggies - Large quantities of antioxidant rich fruits & veggies; 6 or more servings  - Vegetables can be eaten raw or lightly drizzled with oil and cooked  - Vegetables common to the traditional Mediterranean Diet include: artichokes, arugula, beets, broccoli, brussel sprouts, cabbage,  carrots, celery, collard greens, cucumbers, eggplant, kale, leeks, lemons, lettuce, mushrooms, okra, onions, peas, peppers, potatoes, pumpkin, radishes, rutabaga,  shallots, spinach, sweet potatoes, turnips, zucchini - Fruits common to the Mediterranean Diet include: apples, apricots, avocados, cherries, clementines, dates, figs, grapefruits, grapes, melons, nectarines, oranges, peaches, pears, pomegranates, strawberries, tangerines  Fats - Replace butter and margarine with healthy oils, such as olive oil, canola oil, and tahini  - Limit nuts to no more than a handful a day  - Nuts include walnuts, almonds, pecans, pistachios, pine nuts  - Limit or avoid candied, honey roasted or heavily salted nuts - Olives are central to the [employer] - can be eaten whole or used in a variety of dishes   Meats Protein - Limiting red meat: no more than a few times a month - When eating red meat: choose lean cuts and keep the portion to the size of deck of cards - Eggs: approx. 0 to 4 times a week  - Fish and lean poultry: at least 2 a week  - Healthy protein sources include, chicken, [place], lean beef, lamb - Increase intake of seafood such as tuna, salmon, trout, mackerel, shrimp, scallops - Avoid or limit high fat processed meats such as sausage and bacon  Dairy - Include moderate amounts of low fat dairy products  - Focus on healthy dairy such as fat free yogurt, skim milk, low or reduced fat cheese - Limit dairy products higher in fat such as whole or 2% milk, cheese, ice cream  Alcohol - Moderate amounts of red wine is ok  - No more than 5 oz daily for women (all ages) and men older than age 18  - No more than 10 oz of wine daily for men younger than 98  Other - Limit sweets and other desserts  - Use herbs and spices instead of salt to flavor foods  - Herbs and spices common to the traditional Mediterranean Diet include: basil, bay leaves, chives, cloves, cumin, fennel, garlic, lavender, marjoram,  mint, oregano, parsley, pepper, rosemary, sage, savory, sumac, tarragon, thyme   It's not just a diet, it's a lifestyle:  . The Mediterranean diet includes lifestyle factors typical of those in the region  . Foods, drinks and meals are best eaten with others and savored . Daily physical activity is important for overall good health . This could be strenuous exercise like running and aerobics . This could also be more leisurely activities such as walking, housework, yard-work, or taking the stairs . Moderation is the key; a balanced and healthy diet accommodates most foods and drinks . Consider portion sizes and frequency of consumption of certain foods   Meal Ideas & Options:  . Breakfast:  o Whole wheat toast or whole wheat English muffins with peanut butter & hard boiled egg o Steel cut oats topped with apples & cinnamon and skim milk  o Fresh fruit: banana, strawberries, melon, berries, peaches  o Smoothies: strawberries, bananas, greek yogurt, peanut butter o Low fat greek yogurt with blueberries and granola  o Egg white omelet with spinach and mushrooms o Breakfast couscous: whole wheat couscous, apricots, skim milk, cranberries  . Sandwiches:  o Hummus and grilled vegetables (peppers, zucchini, squash) on whole wheat bread   o Grilled chicken on whole wheat pita with lettuce, tomatoes, cucumbers or tzatziki  o Tuna salad on whole wheat bread: tuna salad made with greek yogurt, olives, red peppers, capers, green onions o Garlic rosemary lamb pita: lamb sauted with garlic, rosemary, salt & pepper; add lettuce, cucumber, greek yogurt to pita - flavor with lemon juice and black pepper  .  Seafood:  o Mediterranean grilled salmon, seasoned with garlic, basil, parsley, lemon juice and black pepper o Shrimp, lemon, and spinach whole-grain pasta salad made with low fat greek yogurt  o Seared scallops with lemon orzo  o Seared tuna steaks seasoned salt, pepper, coriander topped with tomato  mixture of olives, tomatoes, olive oil, minced garlic, parsley, green onions and cappers  . Meats:  o Herbed greek chicken salad with kalamata olives, cucumber, feta  o Red bell peppers stuffed with spinach, bulgur, lean ground beef (or lentils) & topped with feta   o Kebabs: skewers of chicken, tomatoes, onions, zucchini, squash  o Turkey burgers: made with red onions, mint, dill, lemon juice, feta cheese topped with roasted red peppers . Vegetarian o Cucumber salad: cucumbers, artichoke hearts, celery, red onion, feta cheese, tossed in olive oil & lemon juice  o Hummus and whole grain pita points with a greek salad (lettuce, tomato, feta, olives, cucumbers, red onion) o Lentil soup with celery, carrots made with vegetable broth, garlic, salt and pepper  o Tabouli salad: parsley, bulgur, mint, scallions, cucumbers, tomato, radishes, lemon juice, olive oil, salt and pepper.      American Heart Association (AHA) Exercise Recommendation  Being physically active is important to prevent heart disease and stroke, the nation's No. 1and No. 5killers. To improve overall cardiovascular health, we suggest at least 150 minutes per week of moderate exercise or 75 minutes per week of vigorous exercise (or a combination of moderate and vigorous activity). Thirty minutes a day, five times a week is an easy goal to remember. You will also experience benefits even if you divide your time into two or three segments of 10 to 15 minutes per day.  For people who would benefit from lowering their blood pressure or cholesterol, we recommend 40 minutes of aerobic exercise of moderate to vigorous intensity three to four times a week to lower the risk for heart attack and stroke.  Physical activity is anything that makes you move your body and burn calories.  This includes things like climbing stairs or playing sports. Aerobic exercises benefit your heart, and include walking, jogging, swimming or biking. Strength and  stretching exercises are best for overall stamina and flexibility.  The simplest, positive change you can make to effectively improve your heart health is to start walking. It's enjoyable, free, easy, social and great exercise. A walking program is flexible and boasts high success rates because people can stick with it. It's easy for walking to become a regular and satisfying part of life.   For Overall Cardiovascular Health:  At least 30 minutes of moderate-intensity aerobic activity at least 5 days per week for a total of 150  OR   At least 25 minutes of vigorous aerobic activity at least 3 days per week for a total of 75 minutes; or a combination of moderate- and vigorous-intensity aerobic activity  AND   Moderate- to high-intensity muscle-strengthening activity at least 2 days per week for additional health benefits.  For Lowering Blood Pressure and Cholesterol  An average 40 minutes of moderate- to vigorous-intensity aerobic activity 3 or 4 times per week  What if I can't make it to the time goal? Something is always better than nothing! And everyone has to start somewhere. Even if you've been sedentary for years, today is the day you can begin to make healthy changes in your life. If you don't think you'll make it for 30 or 40 minutes, set a reachable goal for   today. You can work up toward your overall goal by increasing your time as you get stronger. Don't let all-or-nothing thinking rob you of doing what you can every day.  Source:http://www.heart.org    

## 2017-11-22 NOTE — Progress Notes (Signed)
Patient ID: Stacy Steele, female   DOB: Mar 03, 1976, 42 y.o.   MRN: [id number]     Gynecology Annual Exam   PCP: Redmond School, MD  Chief Complaint:  Chief Complaint  Patient presents with  . Annual Exam    History of Present Illness: Patient is a 42 y.o. G2P1011 presents for annual exam. The patient has no complaints today.   LMP: Patient's last menstrual period was 11/16/2017. Average Interval: irregular, every 2-3 months days Duration of flow: 4 days Heavy Menses: yes Clots: no Intermenstrual Bleeding: no Postcoital Bleeding: no Dysmenorrhea: no  The patient is sexually active. She currently uses none for contraception. She denies dyspareunia. The patient does not perform self breast exams.  There is no notable family history of breast or ovarian cancer in her family. She does request a screening mammogram this year.  The patient wears seatbelts: yes.   The patient has regular exercise: She has joined the gym but has not yet started going regularly.  She has been trying to improve her diet. She admits adequate hydration with h2o.  The patient denies current symptoms of depression.    Review of Systems: Review of Systems  Constitutional: Negative.   HENT: Negative.   Eyes: Negative.   Respiratory: Negative.   Cardiovascular: Negative.   Gastrointestinal: Negative.   Genitourinary: Negative.   Musculoskeletal: Negative.   Skin: Negative.   Neurological: Negative.   Endo/Heme/Allergies: Negative.   Psychiatric/Behavioral: Negative.     Past Medical History:  Past Medical History:  Diagnosis Date  . GERD (gastroesophageal reflux disease)     Past Surgical History:  Past Surgical History:  Procedure Laterality Date  . BACK SURGERY     06/25/2014 lower back  . COLONOSCOPY N/A 11/12/2014   Procedure: COLONOSCOPY;  Surgeon: Rogene Houston, MD;  Location: [facility];  Service: Endoscopy;  Laterality: N/A;  120 - moved to 11:25 - Ann to notify pt     Gynecologic History:  Patient's last menstrual period was 11/16/2017. Contraception: none Last Pap: 4 years ago Results were: no abnormalities   Obstetric History: G2P1011  Family History:  Family History  Problem Relation Age of Onset  . Colon cancer Mother 29    Social History:  Social History   Socioeconomic History  . Marital status: Married    Spouse name: Not on file  . Number of children: Not on file  . Years of education: Not on file  . Highest education level: Not on file  Social Needs  . Financial resource strain: Not on file  . Food insecurity - worry: Not on file  . Food insecurity - inability: Not on file  . Transportation needs - medical: Not on file  . Transportation needs - non-medical: Not on file  Occupational History  . Not on file  Tobacco Use  . Smoking status: Never Smoker  . Smokeless tobacco: Never Used  Substance and Sexual Activity  . Alcohol use: No    Alcohol/week: 0.0 oz  . Drug use: No  . Sexual activity: Yes    Birth control/protection: None  Other Topics Concern  . Not on file  Social History Narrative  . Not on file    Allergies:  No Known Allergies  Medications: Prior to Admission medications   Medication Sig Start Date End Date Taking? Authorizing Provider  lansoprazole (PREVACID) 15 MG capsule Take 15 mg by mouth daily at 12 noon.   Yes [provider]  phentermine 37.5 MG capsule  Take 37.5 mg by mouth every morning.   Yes [provider]  CALCIUM PO Take 1 tablet by mouth daily.    [provider]  HYDROcodone-acetaminophen (NORCO) 10-325 MG per tablet Take 1 tablet by mouth every 6 (six) hours as needed for moderate pain.     [provider]  Prenatal Vit-Fe Fumarate-FA (PRENATAL MULTIVITAMIN) TABS tablet Take 1 tablet by mouth daily at 12 noon.    [provider]  Wheat Dextrin (BENEFIBER DRINK MIX) PACK Take 4 g by mouth at bedtime. Patient not taking: Reported on  11/22/2017 11/12/14   Rogene Houston, MD    Physical Exam Vitals: Blood pressure 126/84, height 5\' 8"  (1.727 m), weight 236 lb (107 kg), last menstrual period 11/16/2017  General: NAD HEENT: normocephalic, anicteric Thyroid: no enlargement, no palpable nodules Pulmonary: No increased work of breathing, CTAB Cardiovascular: RRR, distal pulses 2+ Breast: Breast symmetrical, no tenderness, no palpable nodules or masses, no skin or nipple retraction present, no nipple discharge.  No axillary or supraclavicular lymphadenopathy. Abdomen: NABS, soft, non-tender, non-distended.  Umbilicus without lesions.  No hepatomegaly, splenomegaly or masses palpable. No evidence of hernia  Genitourinary:  External: Normal external female genitalia.  Normal urethral meatus, normal  Bartholin's and Skene's glands.    Vagina: Normal vaginal mucosa, no evidence of prolapse.    Cervix: Grossly normal in appearance, no bleeding, no CMT  Uterus: shared decision making, deferred for no concerns/symptoms    Adnexa: shared decision making, deferred for no concerns/symptoms  Rectal: deferred  Lymphatic: no evidence of inguinal lymphadenopathy Extremities: no edema, erythema, or tenderness Neurologic: Grossly intact Psychiatric: mood appropriate, affect full   Assessment: 42 y.o. G2P1011 routine annual exam  Plan: Problem List Items Addressed This Visit    None    Visit Diagnoses    Well woman exam with routine gynecological exam    -  Primary   Relevant Orders   IGP, Aptima HPV   Cervical cancer screening       Relevant Orders   IGP, Aptima HPV   Breast cancer screening       Relevant Orders   MM DIGITAL SCREENING BILATERAL      1) STI screening was offered and declined  2)  ASCCP guidelines and rational discussed.  Patient opts for every 3 years screening interval  3) Contraception - the patient is currently using  none.  She is happy with her current form of contraception and plans to  continue  4) Routine healthcare maintenance including cholesterol, diabetes screening discussed managed by PCP   5) Referral sent for baseline screening mammogram  6) Return in 1 year (on 11/22/2018) for annual established gyn.   Rod Can, [facility], [facility] 11/22/2017, 4:26 PM

## 2017-11-27 LAB — IGP, APTIMA HPV
HPV Aptima: NEGATIVE
PAP Smear Comment: 0

## 2017-11-30 ENCOUNTER — Encounter: Payer: Self-pay | Admitting: Advanced Practice Midwife

## 2019-01-23 ENCOUNTER — Other Ambulatory Visit: Payer: Self-pay

## 2019-01-23 ENCOUNTER — Ambulatory Visit: Payer: Self-pay | Admitting: Nurse Practitioner

## 2019-01-23 VITALS — BP 128/79 | HR 80 | Temp 98.3°F | Resp 19 | Wt 242.0 lb

## 2019-01-23 DIAGNOSIS — M545 Low back pain, unspecified: Secondary | ICD-10-CM

## 2019-01-23 MED ORDER — KETOROLAC TROMETHAMINE 30 MG/ML IJ SOLN: 30.0000 mg | Freq: Once | INTRAMUSCULAR | Status: AC

## 2019-01-23 MED ORDER — CYCLOBENZAPRINE HCL 10 MG PO TABS: 10.0000 mg | ORAL_TABLET | Freq: Three times a day (TID) | ORAL | 0 refills | Status: AC | PRN

## 2019-01-23 MED ORDER — ETODOLAC 300 MG PO CAPS
300.0000 mg | ORAL_CAPSULE | Freq: Three times a day (TID) | ORAL | 0 refills | Status: DC
Start: 2019-01-23 — End: 2019-01-23

## 2019-01-23 MED ORDER — CYCLOBENZAPRINE HCL 10 MG PO TABS: 10.0000 mg | ORAL_TABLET | Freq: Three times a day (TID) | ORAL | 0 refills | Status: DC | PRN

## 2019-01-23 MED ORDER — ETODOLAC 300 MG PO CAPS: 300.0000 mg | ORAL_CAPSULE | Freq: Three times a day (TID) | ORAL | 0 refills | Status: AC

## 2019-01-23 NOTE — Patient Instructions (Addendum)
Acute Back Pain, Adult  -Take medication as prescribed. -May also take Tylenol extra strength 500 mg, 1 tablet approximately 1 to 2 hours after taking etodolac to help with breakthrough pain as needed. -Apply ice to the affected area for the next 48 hours, then switch to warm, moist heat. -I would like for you to attempt to perform the back exercises I have provided.  If you are performing the exercises and developed pain, stop, but when you attempt again try to push yourself a little further. -It may be helpful to purchase over-the-counter lidocaine patches to apply to the affected area. -Follow-up in the emergency department if you develop loss of bowel or bladder function, lower extremity weakness, numbness, or tingling, or worsening pain. -Based on your history of back surgery, I do want you to follow-up with your PCP to ensure your symptoms are improving.   Acute back pain is sudden and usually short-lived. It is often caused by an injury to the muscles and tissues in the back. The injury may result from:  A muscle or ligament getting overstretched or torn (strained). Ligaments are tissues that connect bones to each other. Lifting something improperly can cause a back strain.  Wear and tear (degeneration) of the spinal disks. Spinal disks are circular tissue that provides cushioning between the bones of the spine (vertebrae).  Twisting motions, such as while playing sports or doing yard work.  A hit to the back.  Arthritis. You may have a physical exam, lab tests, and imaging tests to find the cause of your pain. Acute back pain usually goes away with rest and home care. Follow these instructions at home: Managing pain, stiffness, and swelling  Take over-the-counter and prescription medicines only as told by your health care provider.  Your health care provider may recommend applying ice during the first 24-48 hours after your pain starts. To do this: ? Put ice in a plastic bag. ?  Place a towel between your skin and the bag. ? Leave the ice on for 20 minutes, 2-3 times a day.  If directed, apply heat to the affected area as often as told by your health care provider. Use the heat source that your health care provider recommends, such as a moist heat pack or a heating pad. ? Place a towel between your skin and the heat source. ? Leave the heat on for 20-30 minutes. ? Remove the heat if your skin turns bright red. This is especially important if you are unable to feel pain, heat, or cold. You have a greater risk of getting burned. Activity   Do not stay in bed. Staying in bed for more than 1-2 days can delay your recovery.  Sit up and stand up straight. Avoid leaning forward when you sit, or hunching over when you stand. ? If you work at a desk, sit close to it so you do not need to lean over. Keep your chin tucked in. Keep your neck drawn back, and keep your elbows bent at a right angle. Your arms should look like the letter "L." ? Sit high and close to the steering wheel when you drive. Add lower back (lumbar) support to your car seat, if needed.  Take short walks on even surfaces as soon as you are able. Try to increase the length of time you walk each day.  Do not sit, drive, or stand in one place for more than 30 minutes at a time. Sitting or standing for long periods of  time can put stress on your back.  Do not drive or use heavy machinery while taking prescription pain medicine.  Use proper lifting techniques. When you bend and lift, use positions that put less stress on your back: ? [place] your knees. ? Keep the load close to your body. ? Avoid twisting.  Exercise regularly as told by your health care provider. Exercising helps your back heal faster and helps prevent back injuries by keeping muscles strong and flexible.  Work with a physical therapist to make a safe exercise program, as recommended by your health care provider. Do any exercises as told by your  physical therapist. Lifestyle  Maintain a healthy weight. Extra weight puts stress on your back and makes it difficult to have good posture.  Avoid activities or situations that make you feel anxious or stressed. Stress and anxiety increase muscle tension and can make back pain worse. Learn ways to manage anxiety and stress, such as through exercise. General instructions  Sleep on a firm mattress in a comfortable position. Try lying on your side with your knees slightly bent. If you lie on your back, put a pillow under your knees.  Follow your treatment plan as told by your health care provider. This may include: ? Cognitive or behavioral therapy. ? Acupuncture or massage therapy. ? Meditation or yoga. Contact a health care provider if:  You have pain that is not relieved with rest or medicine.  You have increasing pain going down into your legs or buttocks.  Your pain does not improve after 2 weeks.  You have pain at night.  You lose weight without trying.  You have a fever or chills. Get help right away if:  You develop new bowel or bladder control problems.  You have unusual weakness or numbness in your arms or legs.  You develop nausea or vomiting.  You develop abdominal pain.  You feel faint. Summary  Acute back pain is sudden and usually short-lived.  Use proper lifting techniques. When you bend and lift, use positions that put less stress on your back.  Take over-the-counter and prescription medicines and apply heat or ice as directed by your health care provider. This information is not intended to replace advice given to you by your health care provider. Make sure you discuss any questions you have with your health care provider. Document Released: 09/17/2005 Document Revised: 04/24/2018 Document Reviewed: 05/01/2017 Elsevier Interactive Patient Education  2019 [employer]  Back Exercises The following exercises strengthen the muscles that help to support  the back. They also help to keep the lower back flexible. Doing these exercises can help to prevent back pain or lessen existing pain. If you have back pain or discomfort, try doing these exercises 2-3 times each day or as told by your health care provider. When the pain goes away, do them once each day, but increase the number of times that you repeat the steps for each exercise (do more repetitions). If you do not have back pain or discomfort, do these exercises once each day or as told by your health care provider. Exercises Single Knee to Chest Repeat these steps 3-5 times for each leg: 1. Lie on your back on a firm bed or the floor with your legs extended. 2. Bring one knee to your chest. Your other leg should stay extended and in contact with the floor. 3. Hold your knee in place by grabbing your knee or thigh. 4. Pull on your knee until you feel  a gentle stretch in your lower back. 5. Hold the stretch for 10-30 seconds. 6. Slowly release and straighten your leg. Pelvic Tilt Repeat these steps 5-10 times: 1. Lie on your back on a firm bed or the floor with your legs extended. 2. Bend your knees so they are pointing toward the ceiling and your feet are flat on the floor. 3. Tighten your lower abdominal muscles to press your lower back against the floor. This motion will tilt your pelvis so your tailbone points up toward the ceiling instead of pointing to your feet or the floor. 4. With gentle tension and even breathing, hold this position for 5-10 seconds. Cat-Cow Repeat these steps until your lower back becomes more flexible: 1. Get into a hands-and-knees position on a firm surface. Keep your hands under your shoulders, and keep your knees under your hips. You may place padding under your knees for comfort. 2. Let your head hang down, and point your tailbone toward the floor so your lower back becomes rounded like the back of a cat. 3. Hold this position for 5 seconds. 4. Slowly lift your  head and point your tailbone up toward the ceiling so your back forms a sagging arch like the back of a cow. 5. Hold this position for 5 seconds.  Press-Ups Repeat these steps 5-10 times: 1. Lie on your abdomen (face-down) on the floor. 2. Place your palms near your head, about shoulder-width apart. 3. While you keep your back as relaxed as possible and keep your hips on the floor, slowly straighten your arms to raise the top half of your body and lift your shoulders. Do not use your back muscles to raise your upper torso. You may adjust the placement of your hands to make yourself more comfortable. 4. Hold this position for 5 seconds while you keep your back relaxed. 5. Slowly return to lying flat on the floor.  Bridges Repeat these steps 10 times: 1. Lie on your back on a firm surface. 2. Bend your knees so they are pointing toward the ceiling and your feet are flat on the floor. 3. Tighten your buttocks muscles and lift your buttocks off of the floor until your waist is at almost the same height as your knees. You should feel the muscles working in your buttocks and the back of your thighs. If you do not feel these muscles, slide your feet 1-2 inches farther away from your buttocks. 4. Hold this position for 3-5 seconds. 5. Slowly lower your hips to the starting position, and allow your buttocks muscles to relax completely. If this exercise is too easy, try doing it with your arms crossed over your chest. Abdominal Crunches Repeat these steps 5-10 times: 1. Lie on your back on a firm bed or the floor with your legs extended. 2. Bend your knees so they are pointing toward the ceiling and your feet are flat on the floor. 3. Cross your arms over your chest. 4. Tip your chin slightly toward your chest without bending your neck. 5. Tighten your abdominal muscles and slowly raise your trunk (torso) high enough to lift your shoulder blades a tiny bit off of the floor. Avoid raising your torso  higher than that, because it can put too much stress on your low back and it does not help to strengthen your abdominal muscles. 6. Slowly return to your starting position. Back Lifts Repeat these steps 5-10 times: 1. Lie on your abdomen (face-down) with your arms at your sides,  and rest your forehead on the floor. 2. Tighten the muscles in your legs and your buttocks. 3. Slowly lift your chest off of the floor while you keep your hips pressed to the floor. Keep the back of your head in line with the curve in your back. Your eyes should be looking at the floor. 4. Hold this position for 3-5 seconds. 5. Slowly return to your starting position. Contact a health care provider if:  Your back pain or discomfort gets much worse when you do an exercise.  Your back pain or discomfort does not lessen within 2 hours after you exercise. If you have any of these problems, stop doing these exercises right away. Do not do them again unless your health care provider says that you can. Get help right away if:  You develop sudden, severe back pain. If this happens, stop doing the exercises right away. Do not do them again unless your health care provider says that you can. This information is not intended to replace advice given to you by your health care provider. Make sure you discuss any questions you have with your health care provider. Document Released: 10/25/2004 Document Revised: 01/21/2018 Document Reviewed: 11/11/2014 Elsevier Interactive Patient Education  [employer].

## 2019-01-23 NOTE — Progress Notes (Signed)
History of Present Illness   Patient Identification Stacy Steele is a 43 y.o. female.  Chief Complaint  Back Pain (2 DAYS)   Patient presents with complaint of back pain. This is a result of lifting a heavy object. Onset of pain was 2 days ago and has been gradually worsening since. The pain is located in mid lower back, described as sharp and rated as moderate, without radiation. Denies symptoms to include weakness, numbness, tingling, dysuria, leg pain, leg weakness, new numbness, new weakness, new tingling, abdominal pain, abdominal swelling, incontinence, pelvic pain, perianal numbness.. The patient also denies complaints of fever, dysuria, history of steroid use, pregnancy, menopause, poor posture. Symptoms are exacerbated by sitting and twisting. Symptoms are improved by muscle relaxants and NSAIDs. She has also tried rest which provided mild symptom relief. The patient has no "red flag" history indicative of complicated back pain.  The patient denies any past medical history of cancer, previous history of bone infection, abscess, or disc problems. The patient denies other injuries. Care prior to arrival consisted of OTC arthritis strength pain medicine with minimal relief.  The patient does have a history of back pain that required surgery in 2015.  Patient informs since she had surgery she has not had any problems.    Past Medical History:  Diagnosis Date  . GERD (gastroesophageal reflux disease)    Family History  Problem Relation Age of Onset  . Colon cancer Mother 83   Current Outpatient Medications  Medication Sig Dispense Refill  . lansoprazole (PREVACID) 15 MG capsule Take 15 mg by mouth daily at 12 noon.    . phentermine 37.5 MG capsule Take 37.5 mg by mouth every morning.    Marland Kitchen CALCIUM PO Take 1 tablet by mouth daily.    Marland Kitchen HYDROcodone-acetaminophen (NORCO) 10-325 MG per tablet Take 1 tablet by mouth every 6 (six) hours as needed for moderate pain.     . Prenatal Vit-Fe  Fumarate-FA (PRENATAL MULTIVITAMIN) TABS tablet Take 1 tablet by mouth daily at 12 noon.    . Wheat Dextrin (BENEFIBER DRINK MIX) PACK Take 4 g by mouth at bedtime. (Patient not taking: Reported on 11/22/2017)     No current facility-administered medications for this visit.    No Known Allergies Social History   Socioeconomic History  . Marital status: Married    Spouse name: Not on file  . Number of children: Not on file  . Years of education: Not on file  . Highest education level: Not on file  Occupational History  . Not on file  Social Needs  . Financial resource strain: Not on file  . Food insecurity:    Worry: Not on file    Inability: Not on file  . Transportation needs:    Medical: Not on file    Non-medical: Not on file  Tobacco Use  . Smoking status: Never Smoker  . Smokeless tobacco: Never Used  Substance and Sexual Activity  . Alcohol use: No    Alcohol/week: 0.0 standard drinks  . Drug use: No  . Sexual activity: Yes    Birth control/protection: None  Lifestyle  . Physical activity:    Days per week: Not on file    Minutes per session: Not on file  . Stress: Not on file  Relationships  . Social connections:    Talks on phone: Not on file    Gets together: Not on file    Attends religious service: Not on file  Active member of club or organization: Not on file    Attends meetings of clubs or organizations: Not on file    Relationship status: Not on file  . Intimate partner violence:    Fear of current or ex partner: Not on file    Emotionally abused: Not on file    Physically abused: Not on file    Forced sexual activity: Not on file  Other Topics Concern  . Not on file  Social History Narrative  . Not on file   Review of Systems Constitutional: negative Ears, nose, mouth, throat, and face: negative Respiratory: negative Cardiovascular: negative Gastrointestinal: negative Musculoskeletal:positive for back pain, negative for arthralgias, bone  pain, muscle weakness, myalgias, neck pain and stiff joints Neurological: negative   Physical Exam   BP 128/79 (BP Location: Right Arm, Patient Position: Sitting, Cuff Size: Normal)   Pulse 80   Temp 98.3 F (36.8 C) (Oral)   Resp 19   Wt 242 lb (109.8 kg)   SpO2 98%   BMI 36.80 kg/m  Physical Exam Vitals signs reviewed.  Constitutional:      Comments: Appears uncomfortable due to back pain  HENT:     Head: Normocephalic.  Neck:     Musculoskeletal: Normal range of motion and neck supple.  Cardiovascular:     Rate and Rhythm: Normal rate and regular rhythm.     Pulses: Normal pulses.     Heart sounds: Normal heart sounds.  Pulmonary:     Effort: Pulmonary effort is normal. No respiratory distress.     Breath sounds: No stridor. No wheezing, rhonchi or rales.  Abdominal:     General: Bowel sounds are normal.     Palpations: Abdomen is soft.     Tenderness: There is no abdominal tenderness.  Musculoskeletal:     Right shoulder: She exhibits tenderness (mid lumbarsacral region), pain and spasm. She exhibits no bony tenderness, no swelling, no crepitus, no deformity, normal pulse and normal strength.     Lumbar back: She exhibits tenderness, pain and spasm. She exhibits no swelling, no edema and no deformity.       Arms:     Comments: TTP to mid back.  No crepitus or deformity. Gait is normal, coordination normal. FROM to back. + pain with right lateral bending and twisting to left side. No pain with flexion, extension. No pain bilaterally with abduction, adduction to LE. Right straight leg raise to 45 degrees, left straight leg raise to 60 degrees.  +2 DP, PT pulses bilaterally. Normal leg strength bilaterally, bilateral lower extremities are warm to touch, light touch sensation intact bilaterally.    Lymphadenopathy:     Cervical: No cervical adenopathy.  Skin:    General: Skin is warm and dry.  Neurological:     General: No focal deficit present.     Mental Status: She is  alert and oriented to person, place, and time.  Psychiatric:        Mood and Affect: Mood normal.        Behavior: Behavior normal.     Assessment and Plan:   Exam findings, diagnosis etiology and medication use and indications reviewed with patient. Follow- Up and discharge instructions provided. No emergent/urgent issues found on exam.  Based on the patient's clinical presentation, symptoms, and physical assessment, her findings are consistent with that of mid- low back pain without sciatica. There is no cause for concern as there were no red flags indicated in her exam to include  trauma, fever, incontinence, weight loss, or cancer Do not expect that this is a genitourinary  issue based on the patient's physical exam. to include kidney stones or pyelonephritis as their is a known cause. Patient denied loss of bowel or bladder function along with leg weakness.  Patient's gait was also normal and coordinated.  Informed patient that she should remain active and try to avoid becoming sedentary.  Also provided the patient with stretching exercises.  Informed patient that she will need to follow up with primary care so she can be managed if symptoms do not improve.  Patient also has a history of back problems with surgery that may require further follow-up.  Patient education was provided. Patient verbalized understanding of information provided and agrees with plan of care (POC), all questions answered. The patient is advised to call or return to clinic if conditiondoes not see an improvement in symptoms, or to seek the care of the closest emergency department if conditionworsens with the above plan.  1. Midline low back pain, unspecified chronicity, unspecified whether sciatica present  - ketorolac (TORADOL) 30 MG/ML injection 30 mg - cyclobenzaprine (FLEXERIL) 10 MG tablet; Take 1 tablet (10 mg total) by mouth 3 (three) times daily as needed for muscle spasms.  Dispense: 30 tablet; Refill: 0 -  etodolac (LODINE) 300 MG capsule; Take 1 capsule (300 mg total) by mouth 3 (three) times daily for 10 days.  Dispense: 30 capsule; Refill: 0 -Take medication as prescribed. -May also take Tylenol extra strength 500 mg, 1 tablet approximately 1 to 2 hours after taking etodolac to help with breakthrough pain as needed. -Apply ice to the affected area for the next 48 hours, then switch to warm, moist heat. -I would like for you to attempt to perform the back exercises I have provided.  If you are performing the exercises and developed pain, stop, but when you attempt again try to push yourself a little further. -It may be helpful to purchase over-the-counter lidocaine patches to apply to the affected area. -Follow-up in the emergency department if you develop loss of bowel or bladder function, lower extremity weakness, numbness, or tingling, or worsening pain. -Based on your history of back surgery, I do want you to follow-up with your PCP to ensure your symptoms are improving.

## 2019-01-26 ENCOUNTER — Telehealth: Payer: Self-pay

## 2019-01-26 NOTE — Telephone Encounter (Signed)
Patient did not answered the phone 

## 2019-10-27 ENCOUNTER — Encounter (INDEPENDENT_AMBULATORY_CARE_PROVIDER_SITE_OTHER): Payer: Self-pay | Admitting: *Deleted

## 2022-10-08 DIAGNOSIS — K219 Gastro-esophageal reflux disease without esophagitis: Secondary | ICD-10-CM | POA: Diagnosis not present

## 2022-10-08 DIAGNOSIS — F419 Anxiety disorder, unspecified: Secondary | ICD-10-CM | POA: Diagnosis not present

## 2022-10-08 DIAGNOSIS — F32A Depression, unspecified: Secondary | ICD-10-CM | POA: Diagnosis not present

## 2022-10-16 ENCOUNTER — Ambulatory Visit: Payer: Self-pay | Admitting: Nurse Practitioner

## 2022-12-13 DIAGNOSIS — R5383 Other fatigue: Secondary | ICD-10-CM | POA: Diagnosis not present

## 2022-12-13 DIAGNOSIS — F339 Major depressive disorder, recurrent, unspecified: Secondary | ICD-10-CM | POA: Diagnosis not present

## 2022-12-13 DIAGNOSIS — Z6838 Body mass index (BMI) 38.0-38.9, adult: Secondary | ICD-10-CM | POA: Diagnosis not present

## 2022-12-17 DIAGNOSIS — R748 Abnormal levels of other serum enzymes: Secondary | ICD-10-CM | POA: Diagnosis not present

## 2023-01-14 DIAGNOSIS — R748 Abnormal levels of other serum enzymes: Secondary | ICD-10-CM | POA: Diagnosis not present

## 2023-01-18 DIAGNOSIS — R945 Abnormal results of liver function studies: Secondary | ICD-10-CM | POA: Diagnosis not present

## 2024-06-10 DIAGNOSIS — Z1231 Encounter for screening mammogram for malignant neoplasm of breast: Secondary | ICD-10-CM | POA: Diagnosis not present

## 2024-07-14 DIAGNOSIS — E673 Hypervitaminosis D: Secondary | ICD-10-CM | POA: Diagnosis not present

## 2024-07-14 DIAGNOSIS — Z0000 Encounter for general adult medical examination without abnormal findings: Secondary | ICD-10-CM | POA: Diagnosis not present

## 2024-07-14 DIAGNOSIS — E782 Mixed hyperlipidemia: Secondary | ICD-10-CM | POA: Diagnosis not present

## 2024-07-14 DIAGNOSIS — Z13220 Encounter for screening for lipoid disorders: Secondary | ICD-10-CM | POA: Diagnosis not present

## 2024-07-14 DIAGNOSIS — F418 Other specified anxiety disorders: Secondary | ICD-10-CM | POA: Diagnosis not present

## 2024-07-15 DIAGNOSIS — E673 Hypervitaminosis D: Secondary | ICD-10-CM | POA: Diagnosis not present

## 2024-07-15 DIAGNOSIS — E782 Mixed hyperlipidemia: Secondary | ICD-10-CM | POA: Diagnosis not present

## 2024-07-24 DIAGNOSIS — R7989 Other specified abnormal findings of blood chemistry: Secondary | ICD-10-CM | POA: Diagnosis not present
